# Patient Record
Sex: Female | Born: 1980 | Race: White | Hispanic: No | Marital: Married | Smoking: Never smoker
Health system: Southern US, Community
[De-identification: ages and names within clinical notes are randomized; demographics above are authoritative.]

---

## 2018-12-31 ENCOUNTER — Emergency Department (HOSPITAL_COMMUNITY)
Admission: EM | Admit: 2018-12-31 | Discharge: 2018-12-31 | Disposition: A | Discharge status: Home / Self Care | Payer: PRIVATE HEALTH INSURANCE | Attending: Emergency Medicine | Admitting: Emergency Medicine

## 2018-12-31 ENCOUNTER — Other Ambulatory Visit: Payer: Self-pay

## 2018-12-31 ENCOUNTER — Emergency Department (HOSPITAL_COMMUNITY): Payer: PRIVATE HEALTH INSURANCE

## 2018-12-31 ENCOUNTER — Encounter (HOSPITAL_COMMUNITY): Payer: Self-pay | Admitting: Emergency Medicine

## 2018-12-31 DIAGNOSIS — R0789 Other chest pain: Secondary | ICD-10-CM | POA: Insufficient documentation

## 2018-12-31 DIAGNOSIS — R0602 Shortness of breath: Secondary | ICD-10-CM | POA: Insufficient documentation

## 2018-12-31 LAB — BASIC METABOLIC PANEL
Anion gap: 13 (ref 5–15)
BUN: 9 mg/dL (ref 6–20)
CO2: 21 mmol/L — ABNORMAL LOW (ref 22–32)
Calcium: 9 mg/dL (ref 8.9–10.3)
Chloride: 107 mmol/L (ref 98–111)
Creatinine, Ser: 0.74 mg/dL (ref 0.44–1.00)
GFR calc Af Amer: >60 mL/min (ref >60)
GFR calc non Af Amer: >60 mL/min (ref >60)
Glucose, Bld: 85 mg/dL (ref 70–99)
Potassium: 4 mmol/L (ref 3.5–5.1)
Sodium: 141 mmol/L (ref 135–145)

## 2018-12-31 LAB — CBC
HCT: 41.3 % (ref 36.0–46.0)
Hemoglobin: 13.1 g/dL (ref 12.0–15.0)
MCH: 29.5 pg (ref 26.0–34.0)
MCHC: 31.7 g/dL (ref 30.0–36.0)
MCV: 93 fL (ref 80.0–100.0)
Platelets: 364 10*3/uL (ref 150–400)
RBC: 4.44 MIL/uL (ref 3.87–5.11)
RDW: 13.2 % (ref 11.5–15.5)
WBC: 6.5 10*3/uL (ref 4.0–10.5)
nRBC: 0 % (ref 0.0–0.2)

## 2018-12-31 LAB — TROPONIN I (HIGH SENSITIVITY)
Troponin I (High Sensitivity): <2 ng/L (ref <18)
Troponin I (High Sensitivity): <2 ng/L (ref <18)

## 2018-12-31 LAB — I-STAT BETA HCG BLOOD, ED (MC, WL, AP ONLY): I-stat hCG, quantitative: <5 m[IU]/mL (ref <5)

## 2018-12-31 MED ORDER — SODIUM CHLORIDE 0.9 % IV BOLUS
1000.0000 mL | Freq: Once | INTRAVENOUS | Status: AC
  Administered 2018-12-31: 1000 mL via INTRAVENOUS

## 2018-12-31 MED ORDER — IOHEXOL 350 MG/ML SOLN
100.0000 mL | Freq: Once | INTRAVENOUS | Status: AC | PRN
  Administered 2018-12-31: 100 mL via INTRAVENOUS

## 2018-12-31 MED ORDER — SODIUM CHLORIDE 0.9% FLUSH: 3.0000 mL | Freq: Once | INTRAVENOUS | Status: DC

## 2018-12-31 NOTE — Discharge Instructions (Signed)
Follow-up with cardiology in the morning.  I would suggest not taking her blood pressure medicine until you follow-up with them.  Return to the ED for any new worsening symptoms  Get help right away if: Your chest pain gets worse. You have a cough that gets worse, or you cough up blood. You have severe pain in your abdomen. You faint. You have sudden, unexplained chest discomfort. You have sudden, unexplained discomfort in your arms, back, neck, or jaw. You have shortness of breath at any time. You suddenly start to sweat, or your skin gets clammy. You feel nausea or you vomit. You suddenly feel lightheaded or dizzy. You have severe weakness, or unexplained weakness or fatigue. Your heart begins to beat quickly, or it feels like it is skipping beats.

## 2018-12-31 NOTE — ED Triage Notes (Signed)
Pt states she was started on a beta blocker a week ago. Does not recall the reason why. And states since then she has felt dizzy, had CP and felt SOB.

## 2018-12-31 NOTE — ED Provider Notes (Signed)
[facility] EMERGENCY DEPARTMENT Provider Note   CSN: [id number] Arrival date & time: 12/31/18  1305    History   Chief Complaint Chief Complaint  Patient presents with  . Shortness of Breath  . Chest Pain   HPI Tamara Jordan is a 38 y.o. female with no significant past medical history who presents for evaluation of Sob and chest pain. Patient states she was seen by her PCP and Cardiology in [place], [place]. She was started on possible "rhythm medication" and a beta-blocker 2 weeks ago.  Patient states she was experiencing dizziness.  She was followed back up with cardiology who took her off the rhythm medication.  She is continued on the beta-blocker.  Patient states her dizziness has resolved after she was taken off that medication.  She has continued taking her beta-blocker.  She is unsure what medication she takes her states she takes 25 mg, a half a pill.  Patient states she has had intermittent pleuritic chest pain.  She is unsure if she has ever been diagnosed with any heart murmur, heart disease, arrhythmia, atrial fibrillation.  She is not on anticoagulation.  She is on oral OCPs.  She denies any recent trauma, surgery, immobilization, history of PE or DVTs.  She denies ever being told she had heart failure or coronary artery disease.  Her chest pain is intermittent in nature.  Patient states that since she started on her beta-blocker she gets "winded" multiple times a day.  States she will also have dizziness when she goes from sitting to standing.  Her chest pain is pleuritic in nature.  She denies any recent fever, chills, nausea, vomiting, hemoptysis, abdominal pain, diarrhea, dysuria, lateral weakness, dysphagia, slurred speech, facial droop.  She has not called her cardiologist to follow-up after she was taken off of her antiarrhythmia medication.  Denies additional aggravating or alleviating factors.  Rates her current pain a 3/10.  History obtained from patient  and past medical records.  No interpreter is used.  Per chart review to prior medical medical records patient has been seen for intermittent chest pain and shortness of breath over the last few years.  According to visit in 2018 this was thought to be anxiety related. D-dimer chronically elevated CTA chest negative at that time.     HPI  History reviewed. No pertinent past medical history.  There are no active problems to display for this patient.   History reviewed. No pertinent surgical history.   OB History   No obstetric history on file.      Home Medications    Prior to Admission medications   Medication Sig Start Date End Date Taking? Authorizing Provider  ALPRAZolam Prudy Feeler) 0.5 MG tablet Take 0.5 mg by mouth 2 (two) times daily as needed for anxiety. 12/18/18  Yes [provider]  atenolol (TENORMIN) 25 MG tablet Take 12.5 mg by mouth daily. 12/26/18  Yes [provider]  ibuprofen (ADVIL) 200 MG tablet Take 200 mg by mouth every 6 (six) hours as needed for moderate pain.   Yes [provider]    Family History History reviewed. No pertinent family history.  Social History Social History   Tobacco Use  . Smoking status: Never Smoker  . Smokeless tobacco: Never Used  Substance Use Topics  . Alcohol use: Not Currently  . Drug use: Not Currently     Allergies   Aleve [naproxen sodium], Celexa [citalopram], and Wellbutrin [bupropion]   Review of Systems Review of Systems  Constitutional: Negative.   HENT: Negative.   Eyes: Negative.   Respiratory: Positive for shortness of breath. Negative for apnea, cough, choking, chest tightness, wheezing and stridor.   Cardiovascular: Positive for chest pain. Negative for palpitations and leg swelling.  Genitourinary: Negative.   Musculoskeletal: Negative.   Skin: Negative.   Neurological: Negative.   All other systems reviewed and are negative.    Physical Exam Updated Vital Signs BP  95/66   Pulse 69   Temp 99.2 F (37.3 C) (Oral)   Resp 15   Ht 5\' 7"  (1.702 m)   Wt 89.4 kg   LMP 11/30/2018   SpO2 99%   BMI 30.85 kg/m   Physical Exam Vitals signs and nursing note reviewed.  Constitutional:      General: She is not in acute distress.    Appearance: She is not ill-appearing, toxic-appearing or diaphoretic.  HENT:     Head: Normocephalic and atraumatic.     Jaw: There is normal jaw occlusion.     Mouth/Throat:     Mouth: Mucous membranes are moist.     Pharynx: Oropharynx is clear.  Neck:     Musculoskeletal: Full passive range of motion without pain, normal range of motion and neck supple.     Vascular: No carotid bruit or JVD.     Trachea: Trachea and phonation normal.     Comments: No JVD Cardiovascular:     Rate and Rhythm: Normal rate.     Pulses: Normal pulses.          Radial pulses are 2+ on the right side and 2+ on the left side.       Posterior tibial pulses are 2+ on the right side and 2+ on the left side.     Heart sounds: Normal heart sounds.     Comments: No carotid bruit. No murmur, tachycardia Pulmonary:     Effort: Pulmonary effort is normal.     Breath sounds: Normal breath sounds and air entry.     Comments: Clear to auscultation bilateral without wheeze, rhonchi or rales.  Speaks in full sentences without difficulty. Chest:     Comments: No overlying skin changes to chest wall, no mass, tenderness, deformity or crepitus. Abdominal:     Comments: Abdomen soft, nontender without rebound or guarding.  Musculoskeletal: Normal range of motion.     Right lower leg: She exhibits no tenderness. No edema.     Left lower leg: She exhibits no tenderness. No edema.     Comments: Moves all 4 extremities without difficulty.  No lower extremity edema, erythema or warmth.  Denna HaggardHomans' sign negative.  Calves without swelling or tenderness.  Skin:    Capillary Refill: Capillary refill takes less than 2 seconds.     Comments: Brisk capillary refill.  No  rashes, lesions, induration, fluctuance.  Neurological:     General: No focal deficit present.     Mental Status: She is alert.    ED Treatments / Results  Labs (all labs ordered are listed, but only abnormal results are displayed) Labs Reviewed  BASIC METABOLIC PANEL - Abnormal; Notable for the following components:      Result Value   CO2 21 (*)    All other components within normal limits  CBC  I-STAT BETA HCG BLOOD, ED ([facility], [facility], [facility] ONLY)  TROPONIN I (HIGH SENSITIVITY)  TROPONIN I (HIGH SENSITIVITY)  TROPONIN I (HIGH SENSITIVITY)   EKG EKG Interpretation  Date/Time:  Monday December 31 2018  13:23:22 EST Ventricular Rate:  76 PR Interval:  138 QRS Duration: 82 QT Interval:  388 QTC Calculation: 436 R Axis:   68 Text Interpretation: Normal sinus rhythm Low voltage QRS Nonspecific T wave abnormality Abnormal ECG Confirmed by Geoffery Lyons ([id number]) on 12/31/2018 9:55:39 PM   Radiology Dg Chest 2 View  Result Date: 12/31/2018 CLINICAL DATA:  Shortness of breath. Additional history provided: Shortness of breath upon exertion for "a few weeks." Additional history provided: Dizziness, nausea. EXAM: CHEST - 2 VIEW COMPARISON:  No pertinent prior studies available for comparison. FINDINGS: Heart size within normal limits. There is no focal consolidation within the lungs. No evidence of pleural effusion or pneumothorax. No acute bony abnormality. IMPRESSION: No airspace consolidation. Electronically Signed   By: Jackey Loge DO   On: 12/31/2018 14:48   Ct Angio Chest Pe W/cm &/or Wo Cm  Result Date: 12/31/2018 CLINICAL DATA:  Chest pain EXAM: CT ANGIOGRAPHY CHEST WITH CONTRAST TECHNIQUE: Multidetector CT imaging of the chest was performed using the standard protocol during bolus administration of intravenous contrast. Multiplanar CT image reconstructions and MIPs were obtained to evaluate the vascular anatomy. CONTRAST:  OMNIPAQUE IOHEXOL 350 MG/ML SOLN COMPARISON:  None.  FINDINGS: Cardiovascular: No filling defects in the pulmonary arteries to suggest pulmonary emboli. Heart is normal size. Aorta is normal caliber. Mediastinum/Nodes: No mediastinal, hilar, or axillary adenopathy. Trachea and esophagus are unremarkable. Lungs/Pleura: Bibasilar opacities, likely atelectasis. No effusions. Upper Abdomen: Imaging into the upper abdomen shows no acute findings. Musculoskeletal: Chest wall soft tissues are unremarkable. No acute bony abnormality. Review of the MIP images confirms the above findings. IMPRESSION: No evidence of pulmonary embolus. Bibasilar atelectasis. No acute cardiopulmonary disease. Electronically Signed   By: Charlett Nose M.D.   On: 12/31/2018 21:09   Procedures Procedures (including critical care time)  Medications Ordered in ED Medications  sodium chloride flush (NS) 0.9 % injection 3 mL (has no administration in time range)  sodium chloride 0.9 % bolus 1,000 mL (0 mLs Intravenous Stopped 12/31/18 2003)  iohexol (OMNIPAQUE) 350 MG/ML injection 100 mL (100 mLs Intravenous Contrast Given 12/31/18 2049)   Initial Impression / Assessment and Plan / ED Course  I have reviewed the triage vital signs and the nursing notes.  Pertinent labs & imaging results that were available during my care of the patient were reviewed by me and considered in my medical decision making (see chart for details).  38 year old appears otherwise well presents for evaluation of intermittent CP and SOB. Afebrile, non septic, non ill appearing. Seen by PCP and Cardiology. Started on unknown "rhythym medication" and Beta blockers. Taken off Rhythm med 2/2/ dizziness. No longer dizziness. Non focal neuro exam without deficits. No evidence of CVA or neuro pathology as cause of her dizziness. Denies formal dx of arrhythmia, CAD, structural disease, HF. Patient does not know the reason for the mediations. Has had Intermittent CP, SOB and palpitations x years. Was seen in [place] ED for similar  complaints last year. No evidence of DVT on exam. She is on OCP. Labs and imaging from triage. Per prior chart review chronically elevated D-dimer with CTA given oral OCPs and CP with SOB,  Labs and imaging personally reviewed: CBC without leukocytosis, Hgb 13.1 BMP without acute electrolyte renal or liver abnormalities Pregnancy negative Troponin negative EKG with no STEMI  DG chest without acute infiltrates, cardiomegaly, pulmonary edema, pneumothorax. CTA Chest negative for acute pathology,  Patient in emergency department for greater than 8 hours  without any arrhythmias.  Orthostatic vital signs were negative.  Question whether symptoms related to beta-blocker use.  Discussed with patient close follow-up with cardiology in the morning.  She denies any current chest pain or shortness of breath.  She is able to ambulate in room without any hypoxia or tachycardia.  Patient is to be discharged with recommendation to follow up with PCP in regards to today's hospital visit. Chest pain is not likely of cardiac or pulmonary etiology d/t presentation, PERC negative, VSS, no tracheal deviation, no JVD or new murmur, RRR, breath sounds equal bilaterally, EKG without acute abnormalities, negative troponin, and negative CXR. Pt has been advised to return to the ED if CP becomes exertional, associated with diaphoresis or nausea, radiates to left jaw/arm, worsens or becomes concerning in any way. Pt appears reliable for follow up and is agreeable to discharge.   Heart score 1- Non specific T waves.  Low suspicion for ACS, PE, dissection, [place], myocarditis, pericarditis, pneumothorax, acute bacterial infectious process, acute heart failure which would require inpatient management.  Patient to follow-up outpatient with her cardiologist.      Final Clinical Impressions(s) / ED Diagnoses   Final diagnoses:  SOB (shortness of breath)  Atypical chest pain    ED Discharge Orders    None        Blakeleigh Domek A, PA-C 12/31/18 2157    Veryl Speak, MD 12/31/18 2323

## 2018-12-31 NOTE — ED Notes (Signed)
Patient verbalizes understanding of discharge instructions. Opportunity for questioning and answers were provided. Armband removed by staff, pt discharged from ED ambulatory.   

## 2021-03-05 IMAGING — CT CT ANGIO CHEST
3 of 7 series · 19 of 36 positions shown · IV contrast (OMNI 350)
Comparison: None.

CLINICAL DATA: Chest pain

EXAM:
CT ANGIOGRAPHY CHEST WITH CONTRAST
TECHNIQUE: Multidetector CT imaging of the chest was performed using the
standard protocol during bolus administration of intravenous
contrast. Multiplanar CT image reconstructions and MIPs were
obtained to evaluate the vascular anatomy.
CONTRAST:  100mL OMNIPAQUE IOHEXOL 350 MG/ML SOLN

[Series 8: pe thins · axial · 0.75mm/px · z∈[-92,+139]mm · 16 of 373 slices shown]
[im 22/373  lung]
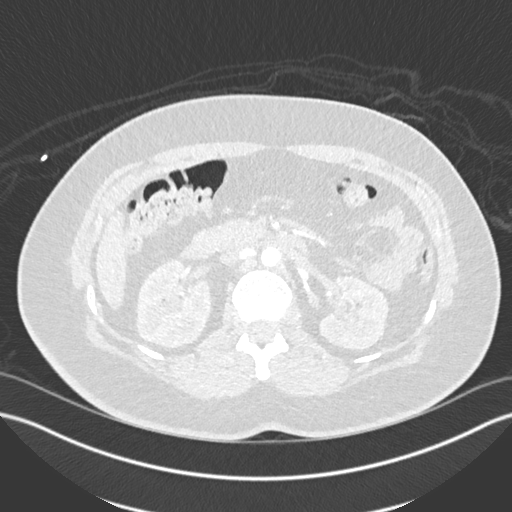
[im 44/373  mediastinal]
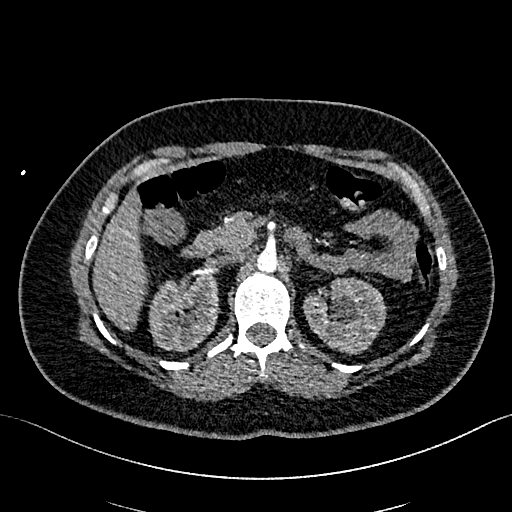
[im 66/373  lung]
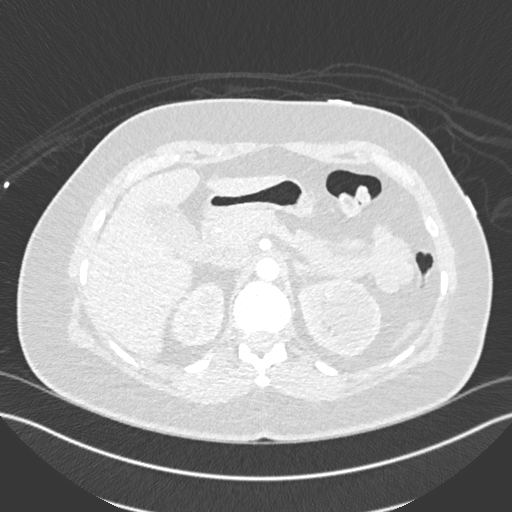
[im 88/373  mediastinal]
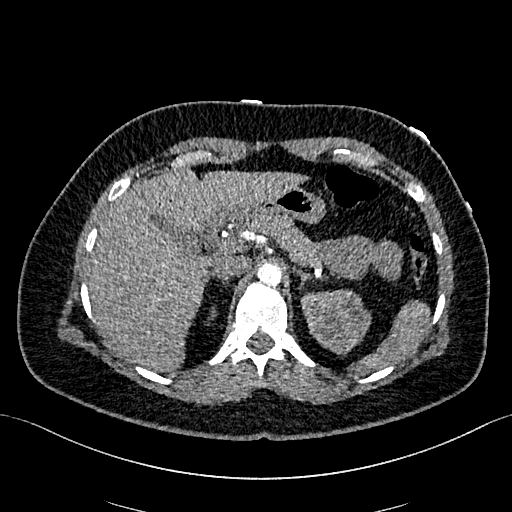
[im 110/373  lung]
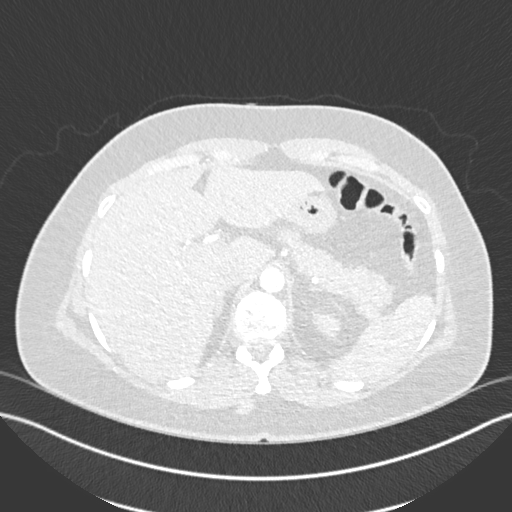
[im 132/373  mediastinal]
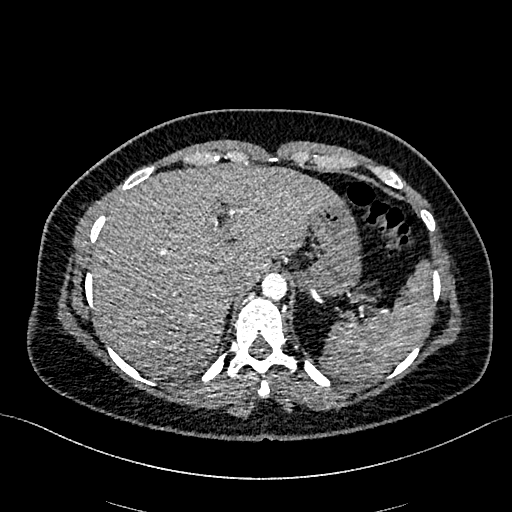
[im 154/373  lung]
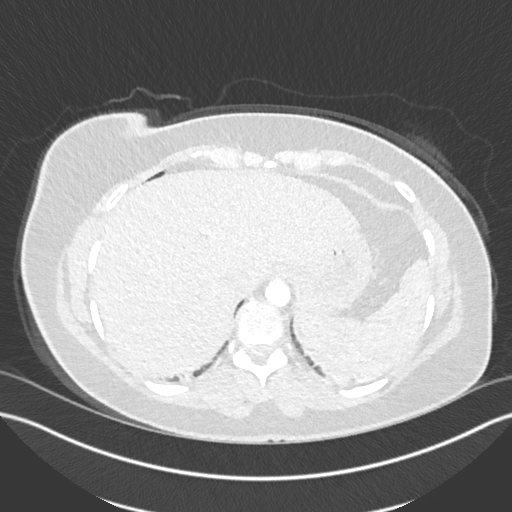
[im 176/373  mediastinal]
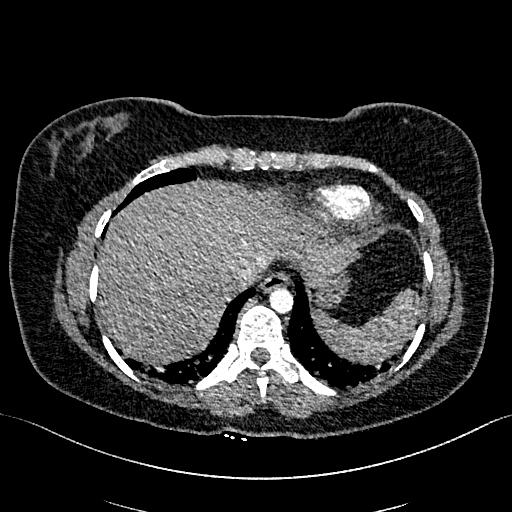
[im 197/373  lung]
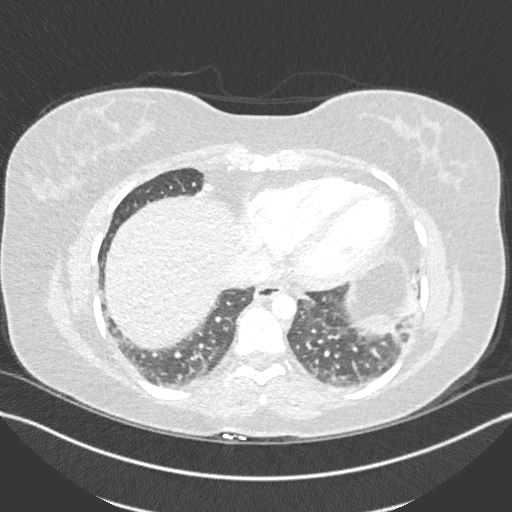
[im 219/373  mediastinal]
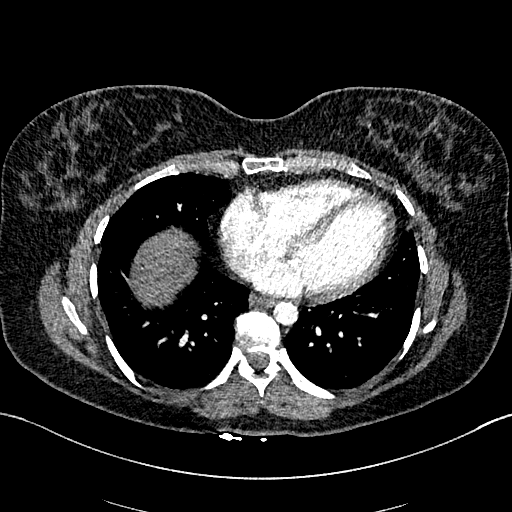
[im 241/373  lung]
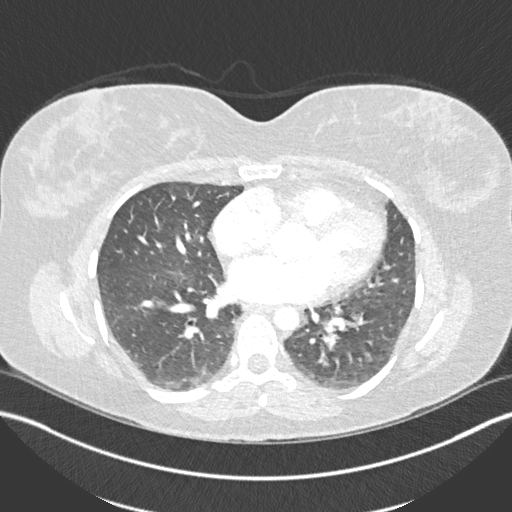
[im 263/373  mediastinal]
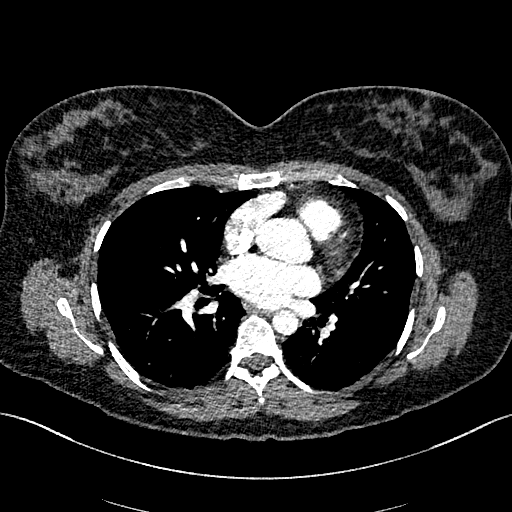
[im 285/373  lung]
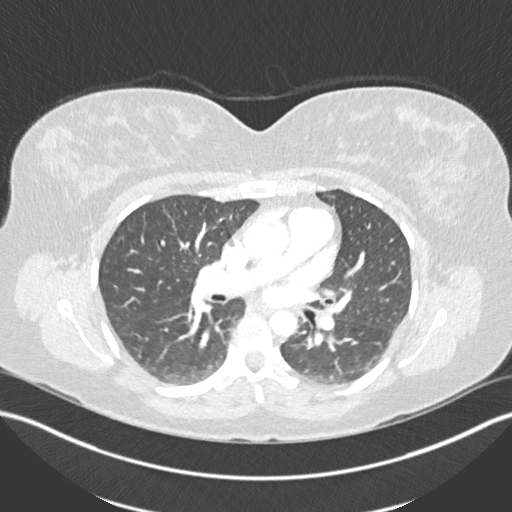
[im 307/373  mediastinal]
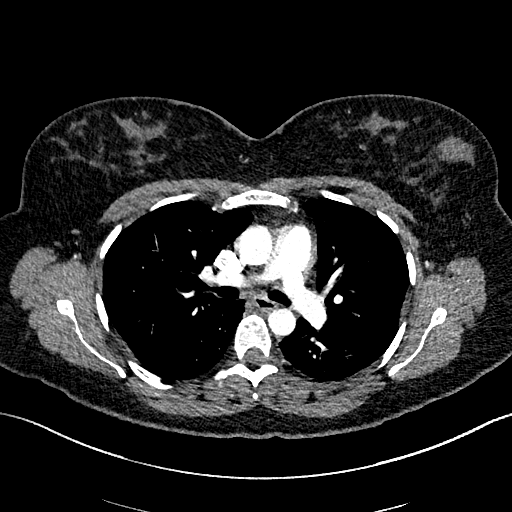
[im 329/373  lung]
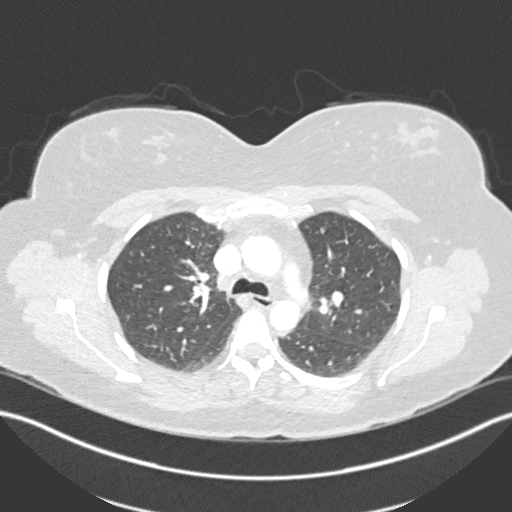
[im 351/373  mediastinal]
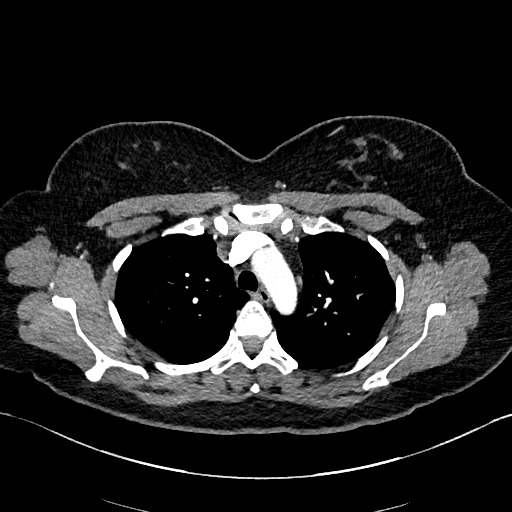

[Series 9: pe lung · axial · 0.75mm/px · z∈[+42,+98]mm · 2 of 85 slices shown]
[im 29/85  mediastinal]
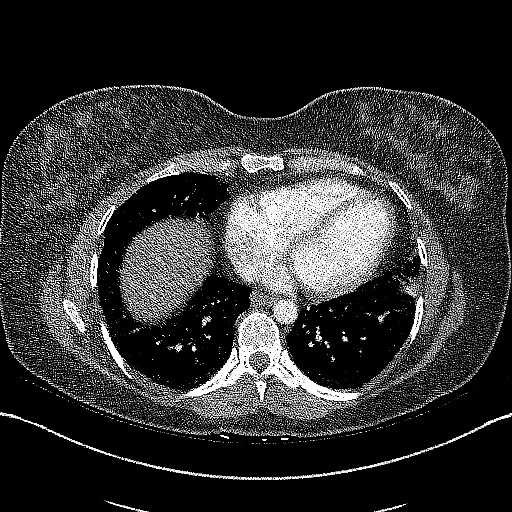
[im 57/85  mediastinal]
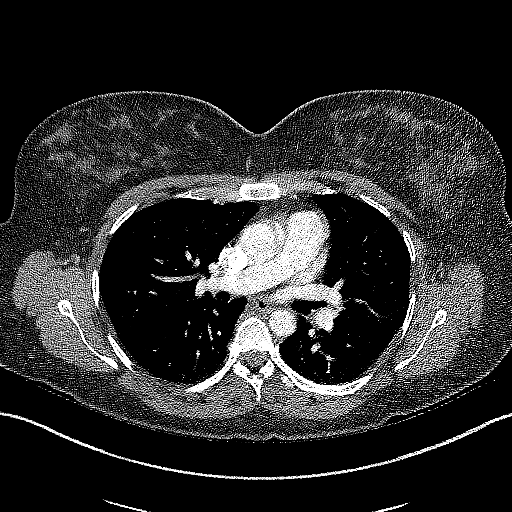

[Series 10: pe 2mm cor · coronal · 0.59mm/px · 1 of 150 slices shown]
[im 75/150  mediastinal]
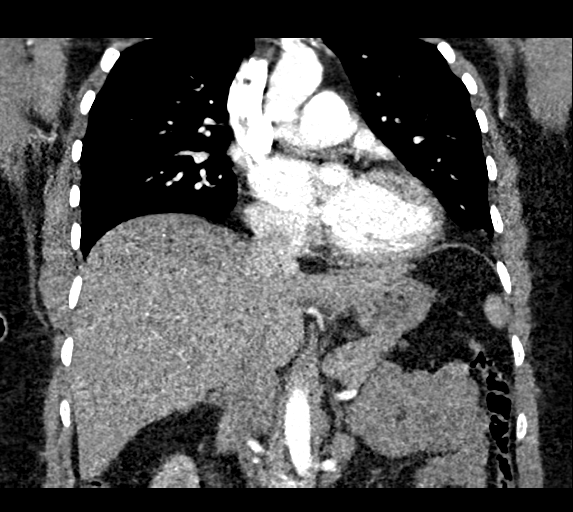

[19 of 36 positions shown; findings below may reference images not displayed]

FINDINGS: Cardiovascular: No filling defects in the pulmonary arteries to
suggest pulmonary emboli. Heart is normal size. Aorta is normal
caliber.

Mediastinum/Nodes: No mediastinal, hilar, or axillary adenopathy.
Trachea and esophagus are unremarkable.

Lungs/Pleura: Bibasilar opacities, likely atelectasis. No effusions.

Upper Abdomen: Imaging into the upper abdomen shows no acute
findings.

Musculoskeletal: Chest wall soft tissues are unremarkable. No acute
bony abnormality.

Review of the MIP images confirms the above findings.
IMPRESSION: No evidence of pulmonary embolus.

Bibasilar atelectasis.

No acute cardiopulmonary disease.

## 2021-03-05 IMAGING — DX DG CHEST 2V
2 series · 2 of 2 positions shown · non-contrast
Comparison: No pertinent prior studies available for comparison.

CLINICAL DATA: Shortness of breath. Additional history provided:
Shortness of breath upon exertion for "a few weeks." Additional
history provided: Dizziness, nausea.

EXAM:
CHEST - 2 VIEW

[chest pa]
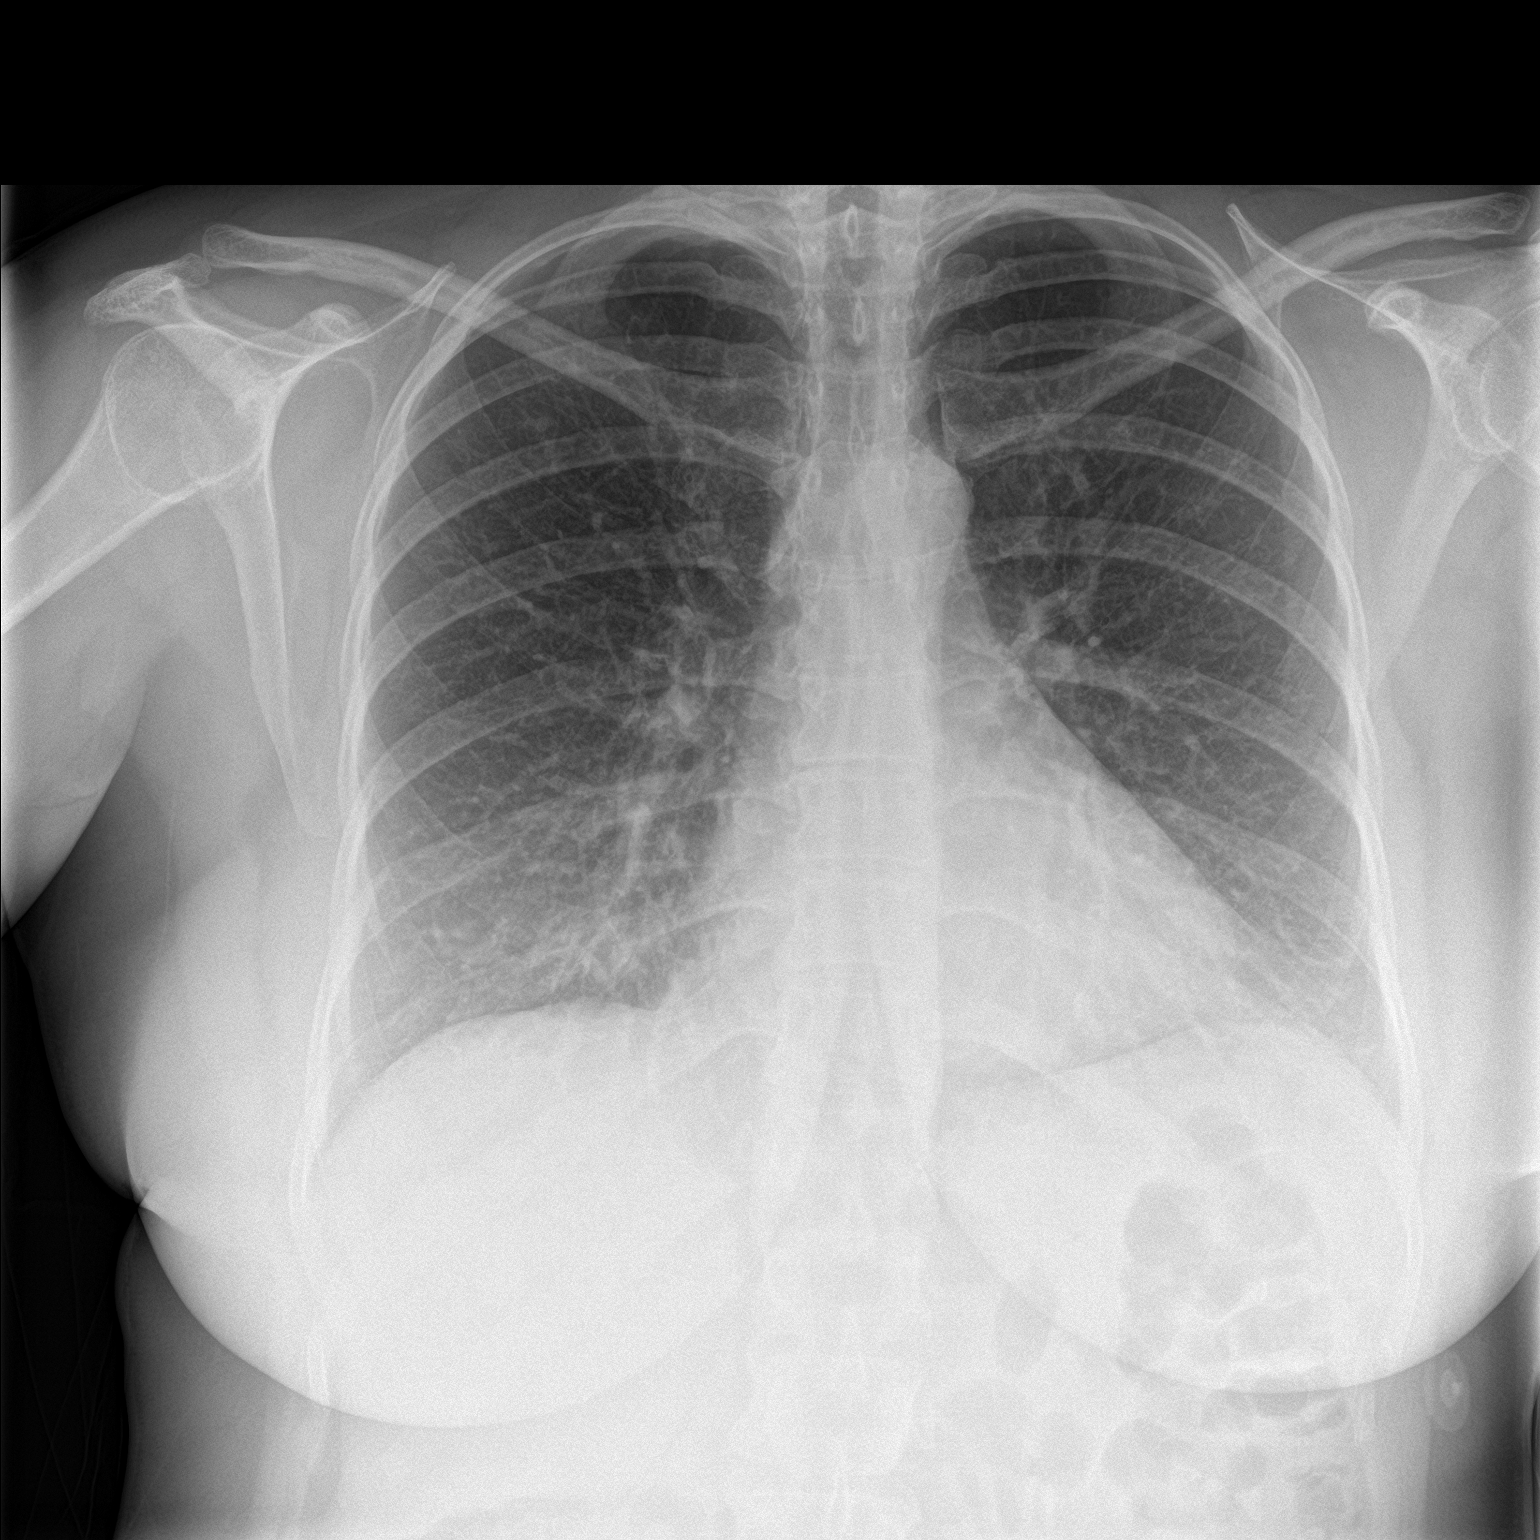

[chest lat]
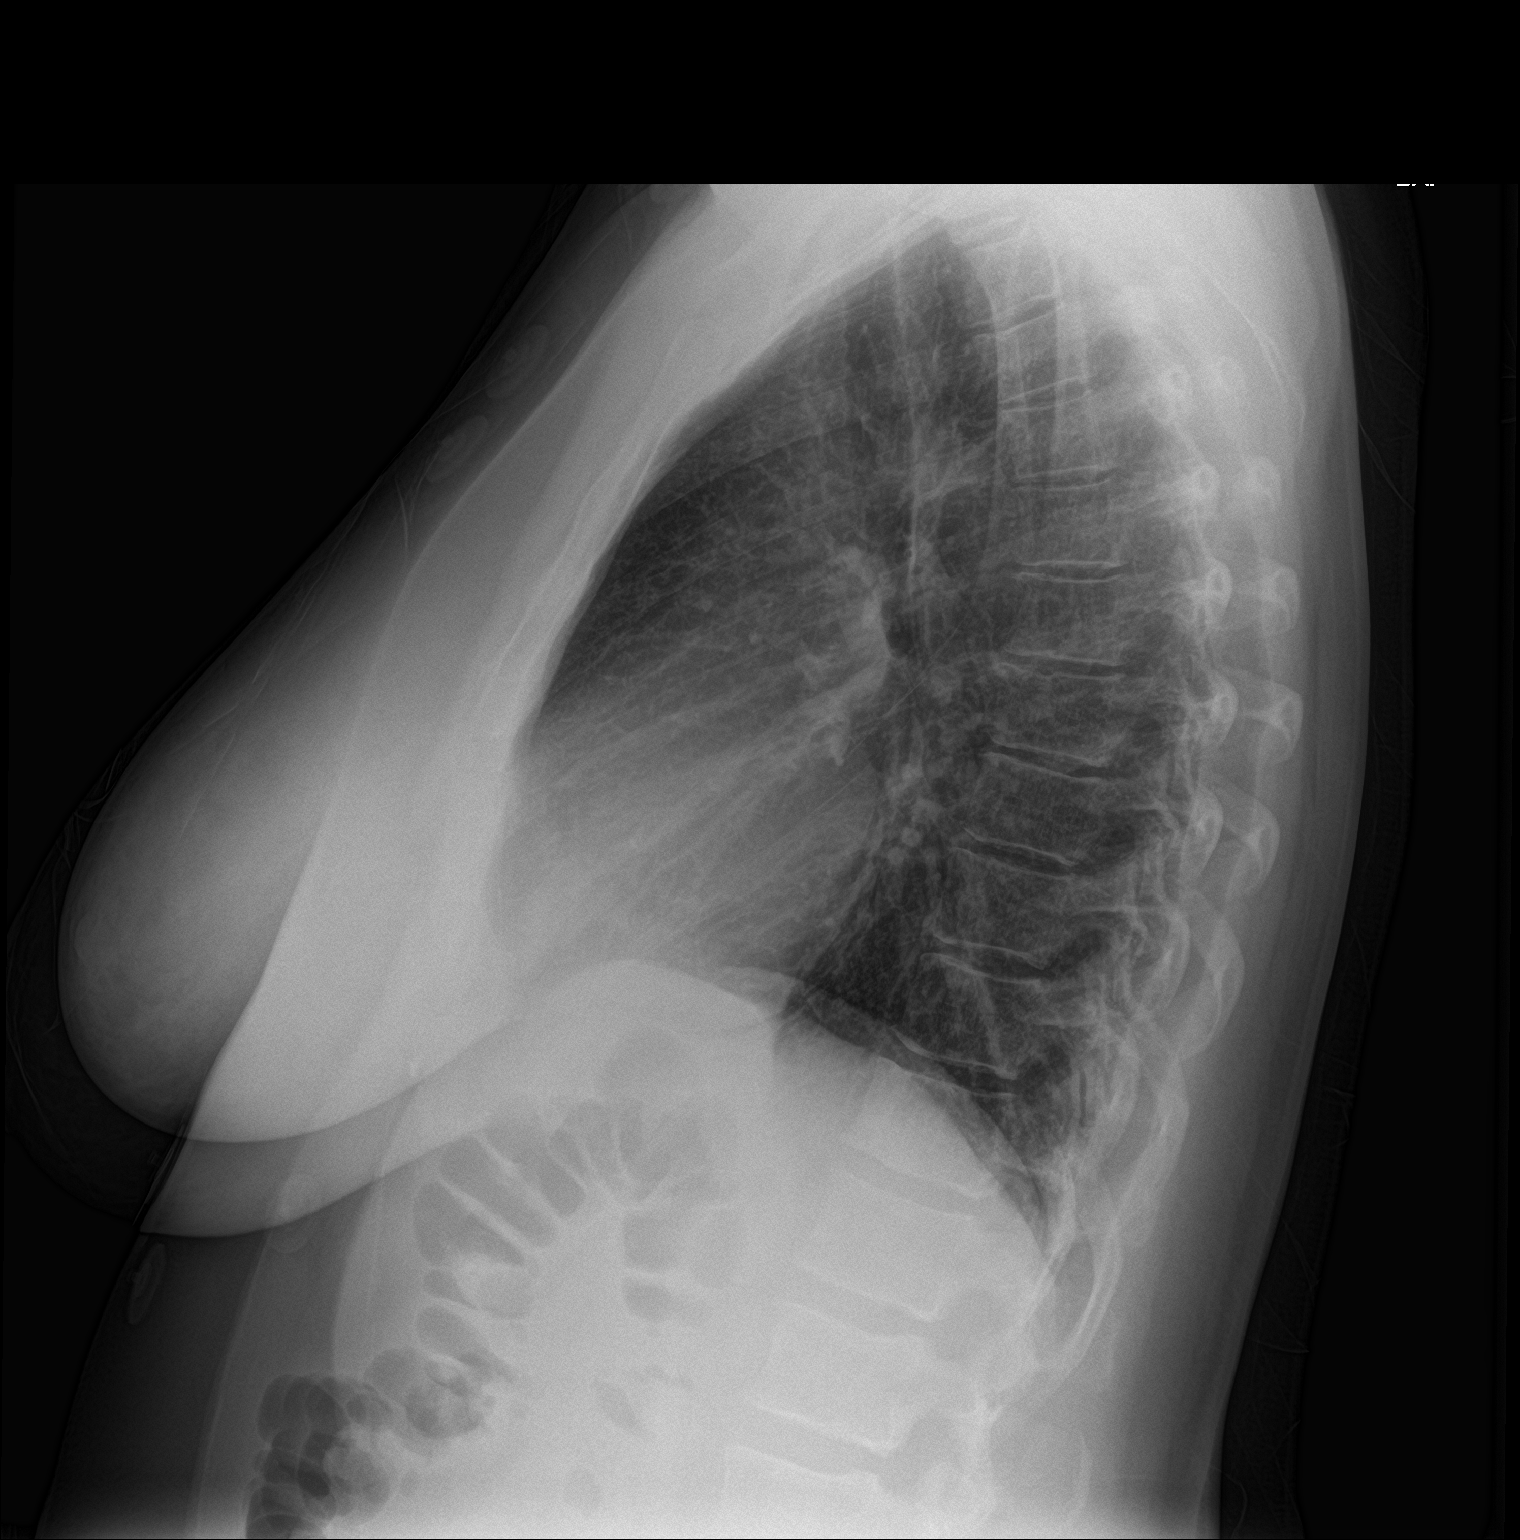

[2 of 2 positions shown; findings below may reference images not displayed]

FINDINGS: Heart size within normal limits.

There is no focal consolidation within the lungs.

No evidence of pleural effusion or pneumothorax.

No acute bony abnormality.
IMPRESSION: No airspace consolidation.
